# Patient Record
Sex: Male | Born: 1965 | Race: White | Hispanic: No | Marital: Married | Smoking: Never smoker
Health system: Southern US, Community
[De-identification: ages and names within clinical notes are randomized; demographics above are authoritative.]

## PROBLEM LIST (undated history)

## (undated) HISTORY — PX: SHOULDER ARTHROSCOPY WITH BICEPS TENDON REPAIR: SHX5674

## (undated) HISTORY — PX: MENISCUS REPAIR: SHX5179

---

## 2005-09-03 ENCOUNTER — Ambulatory Visit: Payer: Self-pay | Admitting: Internal Medicine

## 2005-09-26 ENCOUNTER — Ambulatory Visit: Payer: Self-pay | Admitting: General Practice

## 2006-09-13 ENCOUNTER — Ambulatory Visit: Payer: Self-pay | Admitting: Specialist

## 2007-11-25 ENCOUNTER — Ambulatory Visit: Payer: Self-pay | Admitting: General Practice

## 2007-11-25 ENCOUNTER — Other Ambulatory Visit: Payer: Self-pay

## 2010-10-27 ENCOUNTER — Ambulatory Visit: Payer: Self-pay | Admitting: Family Medicine

## 2012-05-28 ENCOUNTER — Ambulatory Visit: Payer: Self-pay | Admitting: Family Medicine

## 2012-06-06 ENCOUNTER — Ambulatory Visit: Payer: Self-pay | Admitting: Emergency Medicine

## 2012-12-01 ENCOUNTER — Ambulatory Visit: Payer: Self-pay | Admitting: Emergency Medicine

## 2013-01-12 IMAGING — CR DG SHOULDER 3+V*R*
1 series · 3 of 3 positions shown · non-contrast
Comparison: None

REASON FOR EXAM: shoulder pain
COMMENTS:

PROCEDURE:     MDR - MDR SHOULDER RIGHT COMPLETE  - May 28, 2012 [DATE]
RESULT:     History: Pain

[Series 1: internal rotate · 0.17mm/px · 3 of 3 slices shown]
[im 1/3]
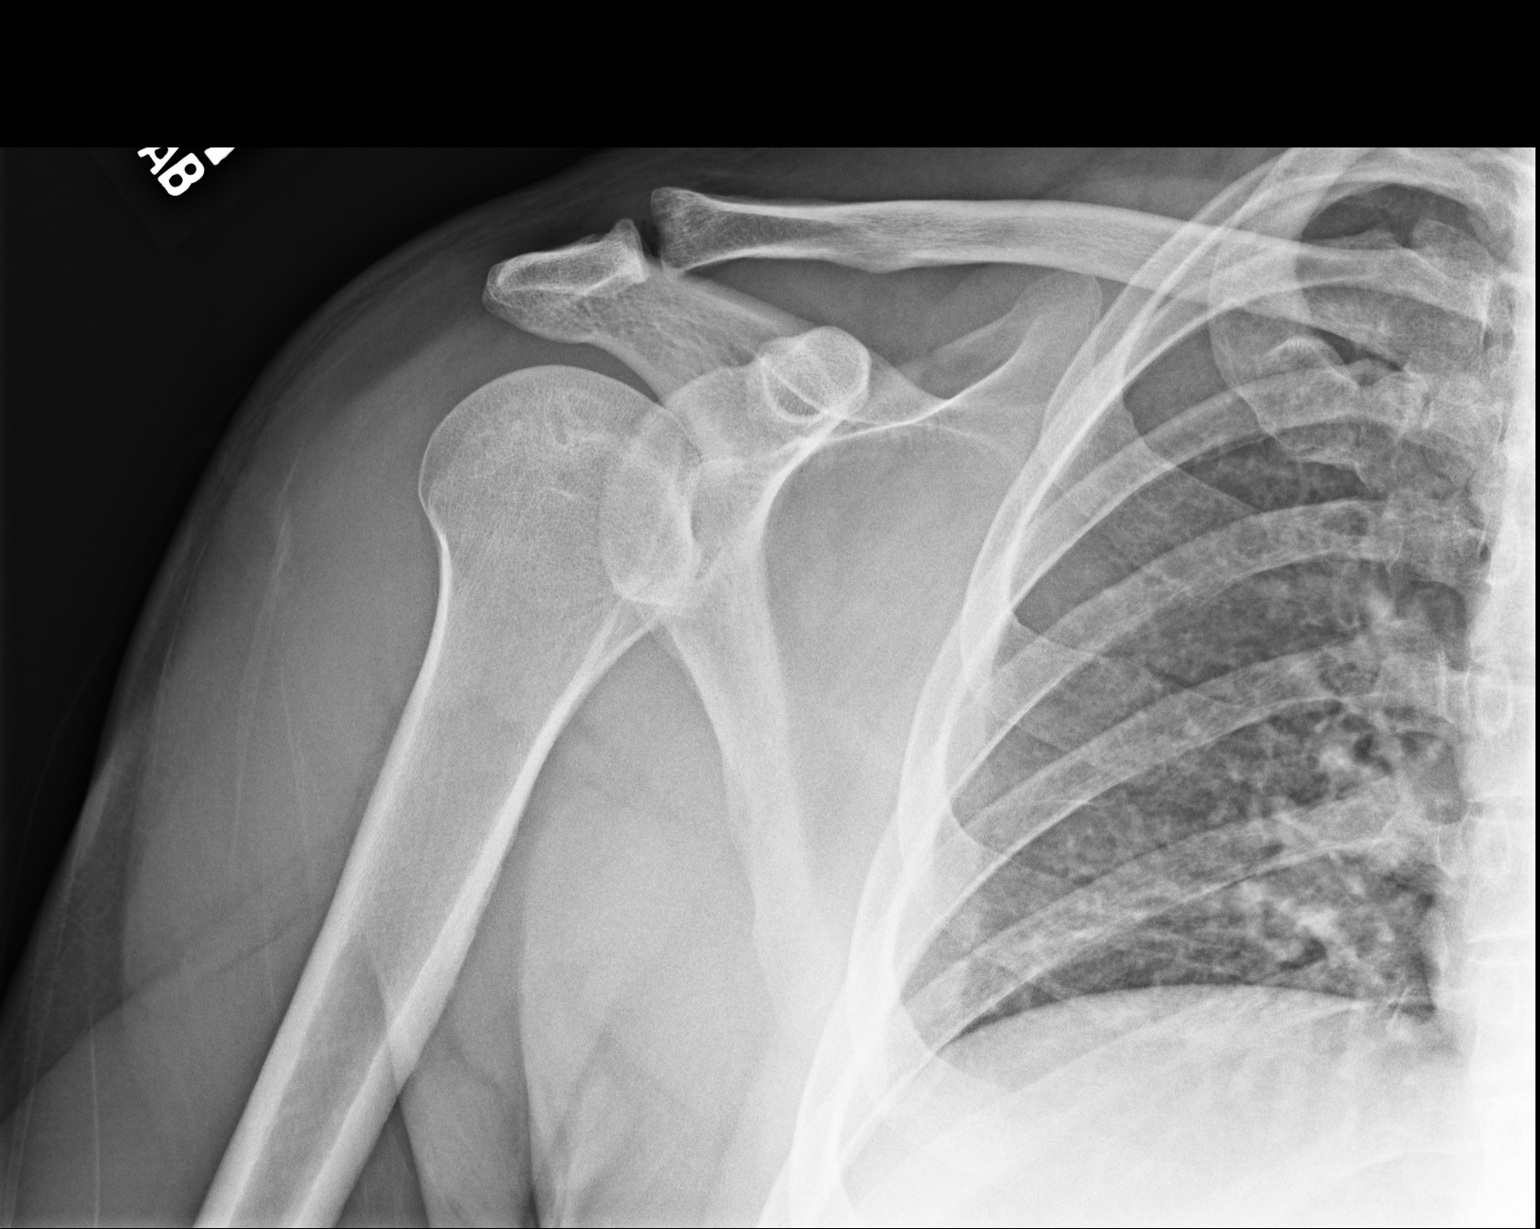
[im 2/3]
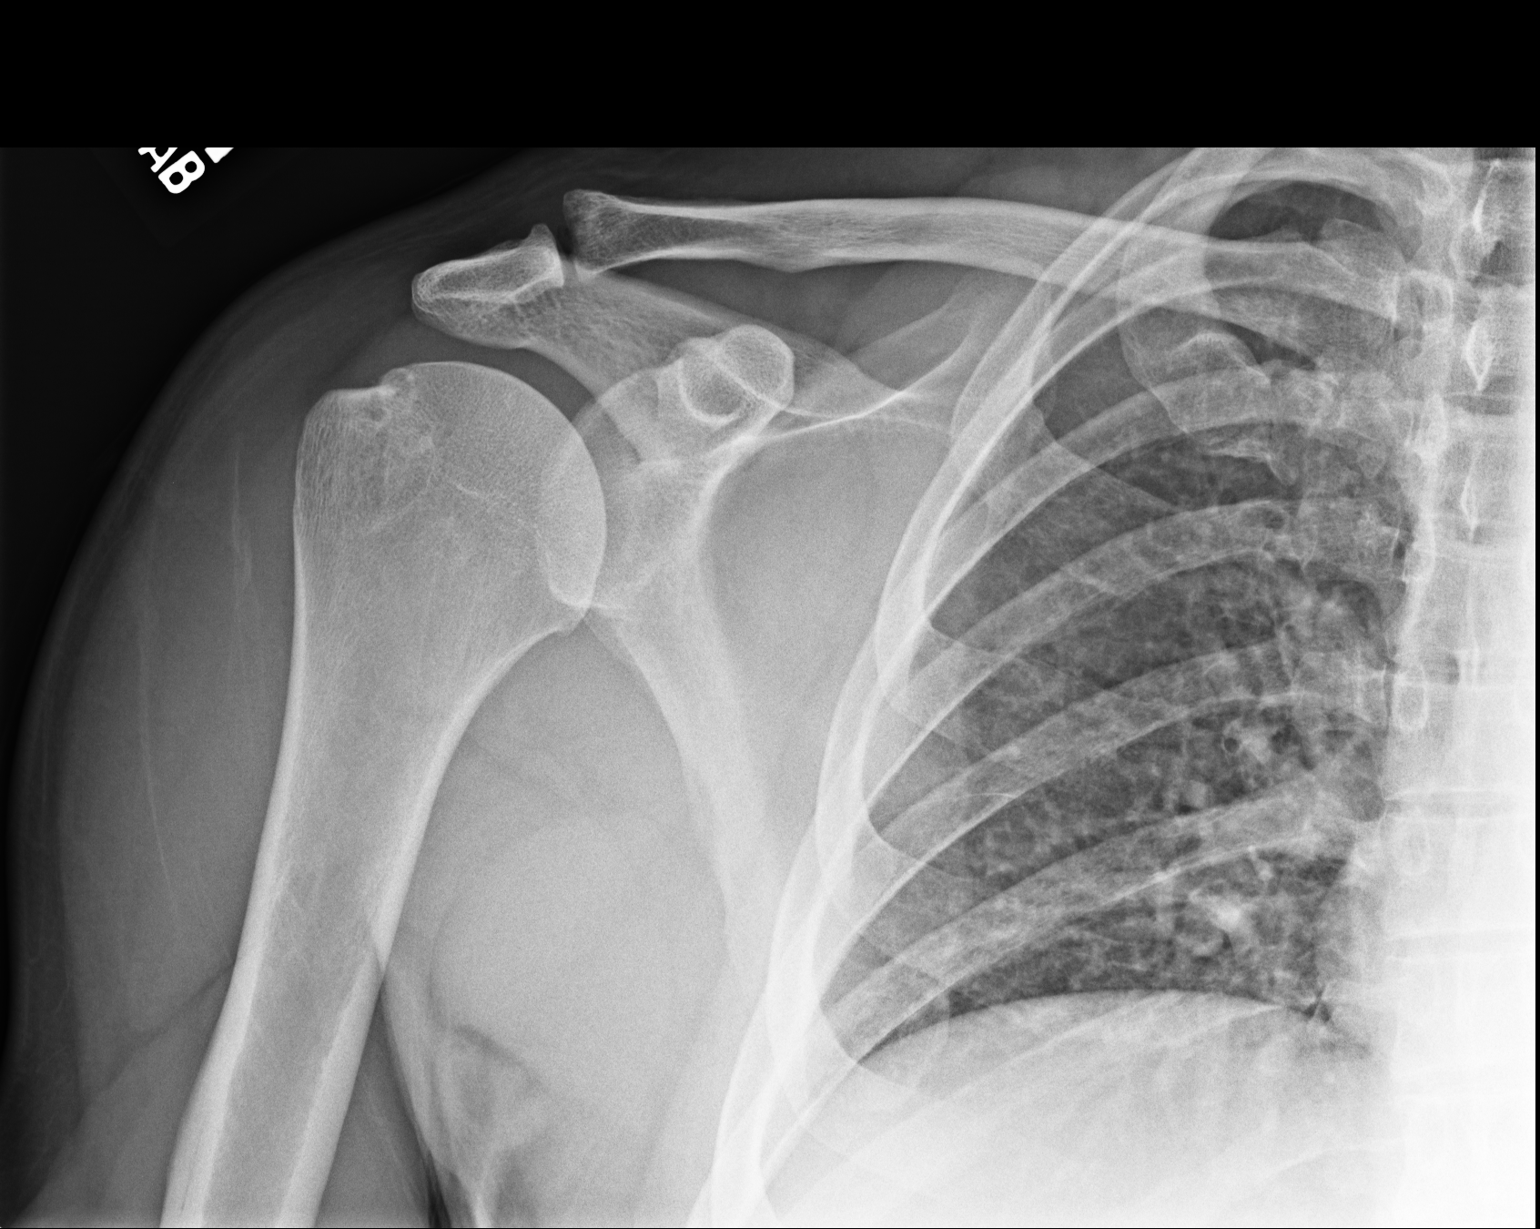
[im 3/3]
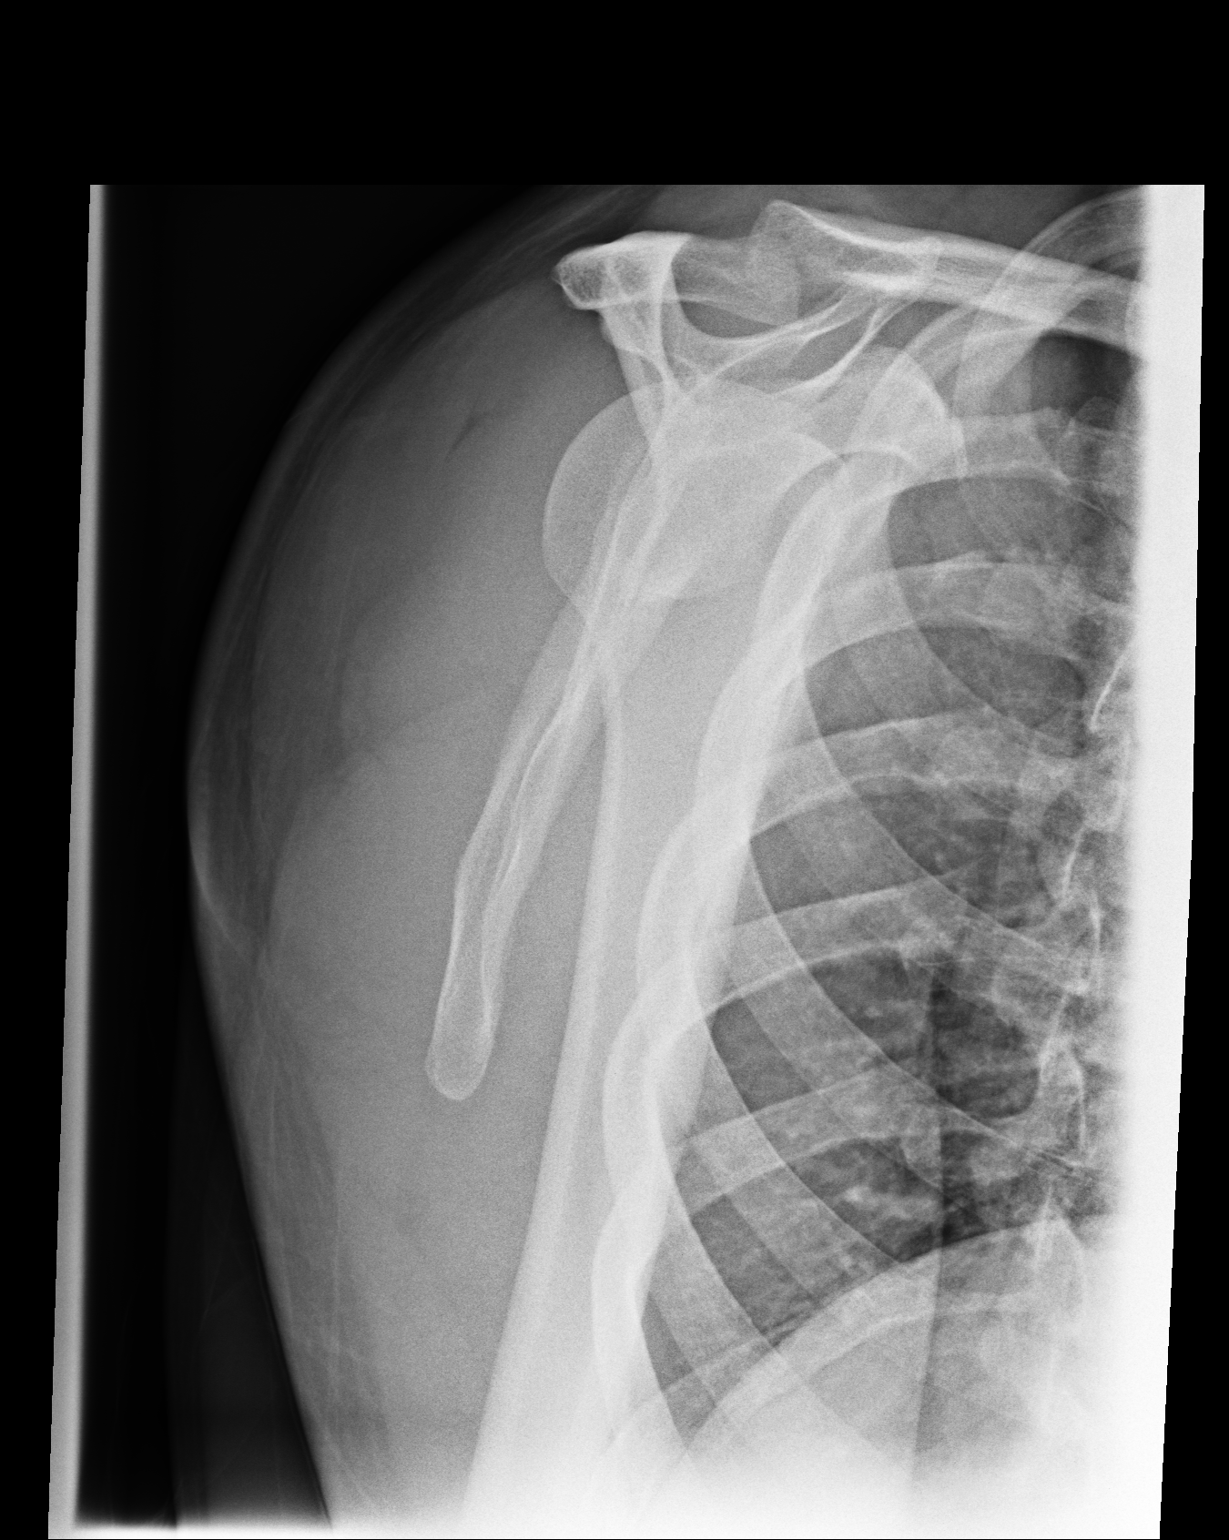

[3 of 3 positions shown; findings below may reference images not displayed]

FINDINGS: 3 views of the right shoulder demonstrates no fracture or dislocation. There
are mild degenerative changes of the AC joint.
IMPRESSION: No acute osseous injury of the right shoulder.

[REDACTED]

## 2013-07-02 ENCOUNTER — Ambulatory Visit: Payer: Self-pay | Admitting: Specialist

## 2013-07-15 ENCOUNTER — Ambulatory Visit: Payer: Self-pay | Admitting: Specialist

## 2015-03-11 NOTE — Op Note (Signed)
PATIENT NAME:  Randy Gates, Randy Gates MR#:  [id number] DATE OF BIRTH:  [birth year]  DATE OF PROCEDURE:  07/15/2013  PREOPERATIVE  DIAGNOSES:  1.  Advanced degenerative arthritis, right acromioclavicular joint.  2.  Impingement syndrome, right shoulder with partial fraying of the  undersurface of the rotator cuff.  3.  Fraying of the biceps tendon and anterior labrum.   POSTOPERATIVE DIAGNOSES: 1.  Advanced degenerative arthritis, right acromioclavicular joint.  2.  Impingement syndrome, right shoulder with partial fraying of the  undersurface of the rotator cuff.  3.  Fraying of the biceps tendon and anterior labrum.   PROCEDURES: 1.  Arthroscopic right distal clavicle excision.  2.  Arthroscopic subacromial decompression.  3.  Arthroscopic biceps tenotomy and debridement of the labrum and undersurface the rotator cuff.   SURGEON: Valinda HoarHoward E. Jamone Garrido, M.D.   ANESTHESIA: General endotracheal plus interscalene block.   COMPLICATIONS: None.   DRAINS: None.   ESTIMATED BLOOD LOSS: Minimal. Replaced: None.   OPERATIVE FINDINGS: The patient had significant fraying of the biceps tendon and with probing it was seen to be degenerative with multiple splits and rents in it longitudinally. The anterior labrum had mild fraying. The undersurface of the rotator cuff showed some partial thickness tearing. The majority of the rotator cuff insertion was intact with a minimal amount of the footprint exposed. The glenohumeral joint was normal. The subacromial bursa showed extensive bursitis. It was quite narrow.  [facility] showed marked spurring inferiorly.   OPERATIVE PROCEDURE: The patient was brought to the operating room where he underwent satisfactory general endotracheal anesthesia in the supine position after a right interscalene block was instilled. He was turned to the left lateral decubitus position and padded appropriately on a beanbag. The right shoulder was prepped and draped in sterile fashion.  Arthroscopy was carried out through posterior, anterior and lateral portals. The above findings were encountered upon the arthroscopy. The initial work was done in the joint. The motorized resector was brought in from anteriorly and the biceps tendon was explored and was seen to be quite frayed with longitudinal rents in it. The ArthroCare wand was brought in and this was used to release the biceps tendon at its base. The motorized resector was again used to smooth off the anterior labrum. The undersurface of the biceps was probed and gently debrided. It had satisfactory insertion and I did not feel that it needed to be released. The arthroscope was redirected into the subacromial space where there was quite a bit of bursitis. This was excised using motorized resector. Undersurface of the acromion was debrided free of soft tissue and a large 5.5 barrel bur was brought in posteriorly and the anterior acromion was flattened out smoothly. This bur was brought over to the [facility] and the undersurface of the acromion was initially debrided. The bur was then brought in from an anterior portal and the complete  distal aspect of the clavicle was removed with the bur leaving a generous space there. Soft tissue debridement was carried out with the ArthroCare wand and motorized resector. Thorough irrigation was carried out. The rotator cuff was evaluated from the dorsal surface and was intact. Instruments were removed and stab wounds closed with 3-0 nylon suture. Marcaine 0.25% with epinephrine and morphine was placed in the joint and the bursa. Dry sterile dressing was applied and a sling was applied. The patient was awakened and taken to recovery in good condition.   ____________________________ Valinda HoarHoward E. Kerrilynn Derenzo, MD hem:dp D: 07/15/2013 09:31:29  ET T: 07/15/2013 10:30:41 ET JOB#: [id number]  cc: Valinda Hoar, MD, <Dictator> Valinda Hoar MD ELECTRONICALLY SIGNED 07/15/2013 12:13

## 2016-04-10 ENCOUNTER — Ambulatory Visit (INDEPENDENT_AMBULATORY_CARE_PROVIDER_SITE_OTHER): Payer: 59 | Admitting: Family Medicine

## 2016-04-10 ENCOUNTER — Encounter: Payer: Self-pay | Admitting: Family Medicine

## 2016-04-10 VITALS — BP 156/86 | HR 80 | Ht 70.0 in | Wt 201.0 lb

## 2016-04-10 DIAGNOSIS — S46912A Strain of unspecified muscle, fascia and tendon at shoulder and upper arm level, left arm, initial encounter: Secondary | ICD-10-CM

## 2016-04-10 MED ORDER — MELOXICAM 15 MG PO TABS: 15.0000 mg | ORAL_TABLET | Freq: Every day | ORAL | Status: DC

## 2016-04-10 MED ORDER — TRAMADOL HCL 50 MG PO TABS: 50.0000 mg | ORAL_TABLET | Freq: Three times a day (TID) | ORAL | Status: DC | PRN

## 2016-04-10 MED ORDER — CYCLOBENZAPRINE HCL 10 MG PO TABS: 10.0000 mg | ORAL_TABLET | Freq: Three times a day (TID) | ORAL | Status: DC | PRN

## 2016-04-10 NOTE — Progress Notes (Signed)
Name: Randy Gates.   MRN: [id number]    DOB: [birth year]   Date:04/10/2016       Progress Note  Subjective  Chief Complaint  Chief Complaint  Patient presents with  . Back Pain    in between shoulder blades more on L) side    Back Pain This is a new problem. The current episode started in the past 7 days. The problem occurs intermittently. The problem has been waxing and waning since onset. The pain is present in the thoracic spine. The pain is at a severity of 5/10. The pain is moderate. The pain is the same all the time. The symptoms are aggravated by twisting. Pertinent negatives include no abdominal pain, bladder incontinence, bowel incontinence, chest pain, dysuria, fever, headaches, numbness, paresis, paresthesias, tingling, weakness or weight loss. He has tried NSAIDs for the symptoms. The treatment provided mild relief.    No problem-specific assessment & plan notes found for this encounter.   No past medical history on file.  Past Surgical History  Procedure Laterality Date  . Meniscus repair Bilateral   . Shoulder arthroscopy with biceps tendon repair Right     No family history on file.  Social History   Social History  . Marital Status: Married    Spouse Name: N/A  . Number of Children: N/A  . Years of Education: N/A   Occupational History  . Not on file.   Social History Main Topics  . Smoking status: Never Smoker   . Smokeless tobacco: Not on file  . Alcohol Use: No  . Drug Use: No  . Sexual Activity: Not on file   Other Topics Concern  . Not on file   Social History Narrative  . No narrative on file    No Known Allergies   Review of Systems  Constitutional: Negative for fever, chills, weight loss and malaise/fatigue.  HENT: Negative for ear discharge, ear pain and sore throat.   Eyes: Negative for blurred vision.  Respiratory: Negative for cough, sputum production, shortness of breath and wheezing.   Cardiovascular: Negative for chest  pain, palpitations and leg swelling.  Gastrointestinal: Negative for heartburn, nausea, abdominal pain, diarrhea, constipation, blood in stool, melena and bowel incontinence.  Genitourinary: Negative for bladder incontinence, dysuria, urgency, frequency and hematuria.  Musculoskeletal: Positive for back pain. Negative for myalgias, joint pain and neck pain.  Skin: Negative for rash.  Neurological: Negative for dizziness, tingling, sensory change, focal weakness, weakness, numbness, headaches and paresthesias.  Endo/Heme/Allergies: Negative for environmental allergies and polydipsia. Does not bruise/bleed easily.  Psychiatric/Behavioral: Negative for depression and suicidal ideas. The patient is not nervous/anxious and does not have insomnia.      Objective  Filed Vitals:   04/10/16 1535  BP: 156/86  Pulse: 80  Height:  (1.778 m)  Weight: 201 lb (91.173 kg)    Physical Exam  Constitutional: He is oriented to person, place, and time and well-developed, well-nourished, and in no distress.  HENT:  Head: Normocephalic.  Right Ear: External ear normal.  Left Ear: External ear normal.  Nose: Nose normal.  Mouth/Throat: Oropharynx is clear and moist.  Eyes: Conjunctivae and EOM are normal. Pupils are equal, round, and reactive to light. Right eye exhibits no discharge. Left eye exhibits no discharge. No scleral icterus.  Neck: Normal range of motion. Neck supple. No JVD present. No tracheal deviation present. No thyromegaly present.  Cardiovascular: Normal rate, regular rhythm, normal heart sounds and intact distal pulses.  Exam  reveals no gallop and no friction rub.   No murmur heard. Pulmonary/Chest: Breath sounds normal. No respiratory distress. He has no wheezes. He has no rales.  Abdominal: Soft. Bowel sounds are normal. He exhibits no mass. There is no hepatosplenomegaly. There is no tenderness. There is no rebound, no guarding and no CVA tenderness.  Musculoskeletal: Normal  range of motion. He exhibits no edema or tenderness.  Lymphadenopathy:    He has no cervical adenopathy.  Neurological: He is alert and oriented to person, place, and time. He has normal sensation, normal strength, normal reflexes and intact cranial nerves. No cranial nerve deficit.  Skin: Skin is warm. No rash noted.  Psychiatric: Mood and affect normal.  Nursing note and vitals reviewed.     Assessment & Plan  Problem List Items Addressed This Visit    None    Visit Diagnoses    Muscle strain of scapular region, left, initial encounter    -  Primary    Relevant Medications    meloxicam (MOBIC) 15 MG tablet    cyclobenzaprine (FLEXERIL) 10 MG tablet    traMADol (ULTRAM) 50 MG tablet         Dr. Hayden Rasmusseneanna Jones [facility]  04/10/2016

## 2017-02-27 DIAGNOSIS — R918 Other nonspecific abnormal finding of lung field: Secondary | ICD-10-CM | POA: Diagnosis not present

## 2017-02-27 DIAGNOSIS — Z0000 Encounter for general adult medical examination without abnormal findings: Secondary | ICD-10-CM | POA: Diagnosis not present

## 2017-02-27 DIAGNOSIS — Z008 Encounter for other general examination: Secondary | ICD-10-CM | POA: Diagnosis not present

## 2024-10-01 ENCOUNTER — Ambulatory Visit
Admission: EM | Admit: 2024-10-01 | Discharge: 2024-10-01 | Disposition: A | Discharge status: Home / Self Care | Attending: Physician Assistant | Admitting: Physician Assistant

## 2024-10-01 DIAGNOSIS — I10 Essential (primary) hypertension: Secondary | ICD-10-CM | POA: Diagnosis not present

## 2024-10-01 DIAGNOSIS — S46912A Strain of unspecified muscle, fascia and tendon at shoulder and upper arm level, left arm, initial encounter: Secondary | ICD-10-CM

## 2024-10-01 MED ORDER — LISINOPRIL 10 MG PO TABS: 10.0000 mg | ORAL_TABLET | Freq: Every day | ORAL | 2 refills | Status: AC

## 2024-10-01 NOTE — ED Provider Notes (Signed)
 [facility]    CSN: [id number] Arrival date & time: 10/01/24  1059      History   Chief Complaint Chief Complaint  Patient presents with   Hypertension    HPI Randy Gates. is a 58 y.o. male presenting for concerns of elevated blood pressure readings.  Patient reports having a history of hypertension but states he lost weight and changed his lifestyle and was able to come off the medication.  Patient recently had a physical for work.  Blood pressure was elevated at 190/110 immediately postexercise and 148/102 at rest.  Patient says he has had borderline elevated blood pressure over the past couple of years but never anything significantly high.  140s over 90s is typical.  Does not currently have a PCP and is not currently on any blood pressure medication.  He states that his employer told him in order to maintain his job he will need to be on medication again.  Additionally patient had labs performed through work including CBC, CMP, TSH, A1c and lipid panel.  I reviewed all of his lab work.  Mildly elevated cholesterol and glucose but otherwise normal findings.  Normal CBC.  Normal CMP other than elevated glucose.  Normal TSH and A1c was 5.4.  HPI  History reviewed. No pertinent past medical history.  There are no active problems to display for this patient.   Past Surgical History:  Procedure Laterality Date   MENISCUS REPAIR Bilateral    SHOULDER ARTHROSCOPY WITH BICEPS TENDON REPAIR Right        Home Medications    Prior to Admission medications   Medication Sig Start Date End Date Taking? Authorizing Provider  lisinopril (ZESTRIL) 10 MG tablet Take 1 tablet (10 mg total) by mouth daily. 10/01/24  Yes Arvis Jolan NOVAK PA-C    Family History History reviewed. No pertinent family history.  Social History Social History   Tobacco Use   Smoking status: Never   Smokeless tobacco: Never  Vaping Use   Vaping status: Never Used  Substance Use Topics    Alcohol use: No    Alcohol/week: 0.0 standard drinks of alcohol   Drug use: No     Allergies   Patient has no known allergies.   Review of Systems Review of Systems  Constitutional:  Negative for diaphoresis and fatigue.  Eyes:  Negative for visual disturbance.  Respiratory:  Negative for shortness of breath.   Cardiovascular:  Negative for chest pain and palpitations.  Gastrointestinal:  Negative for nausea and vomiting.  Neurological:  Negative for dizziness, syncope, weakness, light-headedness, numbness and headaches.     Physical Exam Triage Vital Signs ED Triage Vitals [10/01/24 1127]  Encounter Vitals Group     BP (!) 150/93     Girls Systolic BP Percentile      Girls Diastolic BP Percentile      Boys Systolic BP Percentile      Boys Diastolic BP Percentile      Pulse Rate 91     Resp 16     Temp 98.3 F (36.8 C)     Temp Source Oral     SpO2 100 %     Weight      Height      Head Circumference      Peak Flow      Pain Score 0     Pain Loc      Pain Education      Exclude from Growth Chart  No data found.  Updated Vital Signs BP (!) 148/86 (BP Location: Left Arm)   Pulse 91   Temp 98.3 F (36.8 C) (Oral)   Resp 16   SpO2 100%     Physical Exam Vitals and nursing note reviewed.  Constitutional:      General: He is not in acute distress.    Appearance: Normal appearance. He is well-developed. He is not ill-appearing.  HENT:     Head: Normocephalic and atraumatic.     Nose: Nose normal.     Mouth/Throat:     Mouth: Mucous membranes are moist.     Pharynx: Oropharynx is clear.  Eyes:     General: No scleral icterus.    Conjunctiva/sclera: Conjunctivae normal.  Cardiovascular:     Rate and Rhythm: Normal rate and regular rhythm.  Pulmonary:     Effort: Pulmonary effort is normal. No respiratory distress.     Breath sounds: Normal breath sounds.  Musculoskeletal:     Cervical back: Neck supple.  Skin:    General: Skin is warm and dry.      Capillary Refill: Capillary refill takes less than 2 seconds.  Neurological:     General: No focal deficit present.     Mental Status: He is alert. Mental status is at baseline.     Motor: No weakness.     Gait: Gait normal.  Psychiatric:        Mood and Affect: Mood normal.        Behavior: Behavior normal.      UC Treatments / Results  Labs (all labs ordered are listed, but only abnormal results are displayed) Labs Reviewed - No data to display  EKG   Radiology No results found.  Procedures Procedures (including critical care time)  Medications Ordered in UC Medications - No data to display  Initial Impression / Assessment and Plan / UC Course  I have reviewed the triage vital signs and the nursing notes.  Pertinent labs & imaging results that were available during my care of the patient were reviewed by me and considered in my medical decision making (see chart for details).   58 year old male with history of hypertension presents for mildly elevated blood pressure readings in the 140s over 90s.  Patient was previously on lisinopril but was able to discontinue the medication after making lifestyle changes.  At work he had elevated blood pressure readings and his employer told him he needed to be on medication to keep his job.  He does not currently have a PCP.  See HPI.  I reviewed patient's numerous labs.  Mildly elevated cholesterol, otherwise no significant findings.  BP today is 150/93.  Recheck is 148/86.  Will restart patient on lisinopril.  Explained that he will need to follow-up with primary care to have further prescription refills.  Encouraged him to continue making healthy lifestyle choices.  Reviewed return precautions.   Final Clinical Impressions(s) / UC Diagnoses   Final diagnoses:  Essential hypertension     Discharge Instructions      - Blood pressure is little elevated so we are putting you back on lisinopril.  Keep a log of your blood  pressures at home.  If you make certain lifestyle changes such as exercise more frequently, watch your diet and salt intake, lose weight if overweight you may be able to discontinue medication but you should discuss this with your primary care provider. - Decrease your consumption of fatty foods.  Add fish oil.  This may help your cholesterol.  Have cholesterol rechecked in a couple months by your primary care provider.    ED Prescriptions     Medication Sig Dispense Auth. Provider   lisinopril (ZESTRIL) 10 MG tablet Take 1 tablet (10 mg total) by mouth daily. 30 tablet Geovanna Simko B, PA-C      PDMP not reviewed this encounter.   Arvis Jolan NOVAK, PA-C 10/01/24 1257

## 2024-10-01 NOTE — ED Triage Notes (Signed)
 Patient presents to  [facility] for HTN. Reports he had a physical 2 days ago. At rest BP 148/102, post exercise his BP was 190/110. He does not have a PCP, he was instructed to come in for eval and to initiate HTN treatment.

## 2024-10-01 NOTE — Discharge Instructions (Addendum)
-   Blood pressure is little elevated so we are putting you back on lisinopril.  Keep a log of your blood pressures at home.  If you make certain lifestyle changes such as exercise more frequently, watch your diet and salt intake, lose weight if overweight you may be able to discontinue medication but you should discuss this with your primary care provider. - Decrease your consumption of fatty foods.  Add fish oil.  This may help your cholesterol.  Have cholesterol rechecked in a couple months by your primary care provider.
